# Patient Record
Sex: Male | Born: 1965 | Race: White | Hispanic: No | Smoking: Current some day smoker
Health system: Southern US, Community
[De-identification: ages and names within clinical notes are randomized; demographics above are authoritative.]

## PROBLEM LIST (undated history)

## (undated) DIAGNOSIS — F329 Major depressive disorder, single episode, unspecified: Secondary | ICD-10-CM

## (undated) DIAGNOSIS — F32A Depression, unspecified: Secondary | ICD-10-CM

## (undated) DIAGNOSIS — F419 Anxiety disorder, unspecified: Secondary | ICD-10-CM

## (undated) DIAGNOSIS — R569 Unspecified convulsions: Secondary | ICD-10-CM

## (undated) DIAGNOSIS — M810 Age-related osteoporosis without current pathological fracture: Secondary | ICD-10-CM

## (undated) HISTORY — DX: Depression, unspecified: F32.A

## (undated) HISTORY — DX: Anxiety disorder, unspecified: F41.9

## (undated) HISTORY — DX: Age-related osteoporosis without current pathological fracture: M81.0

## (undated) HISTORY — DX: Major depressive disorder, single episode, unspecified: F32.9

## (undated) HISTORY — PX: CERVICAL FUSION: SHX112

---

## 2003-07-12 ENCOUNTER — Emergency Department (HOSPITAL_COMMUNITY): Admission: EM | Admit: 2003-07-12 | Discharge: 2003-07-13 | Payer: Self-pay | Admitting: *Deleted

## 2004-09-19 ENCOUNTER — Ambulatory Visit (HOSPITAL_COMMUNITY): Admission: RE | Admit: 2004-09-19 | Discharge: 2004-09-19 | Payer: Self-pay | Admitting: *Deleted

## 2008-10-25 ENCOUNTER — Ambulatory Visit (HOSPITAL_COMMUNITY): Admission: RE | Admit: 2008-10-25 | Discharge: 2008-10-25 | Payer: Self-pay | Admitting: *Deleted

## 2010-03-31 ENCOUNTER — Encounter: Payer: Self-pay | Admitting: *Deleted

## 2011-08-19 ENCOUNTER — Ambulatory Visit (HOSPITAL_COMMUNITY): Payer: Self-pay | Admitting: Physical Therapy

## 2012-02-12 ENCOUNTER — Emergency Department (HOSPITAL_COMMUNITY)
Admission: EM | Admit: 2012-02-12 | Discharge: 2012-02-13 | Disposition: A | Discharge status: Home / Self Care | Payer: Medicaid Other | Attending: Emergency Medicine | Admitting: Emergency Medicine

## 2012-02-12 ENCOUNTER — Encounter (HOSPITAL_COMMUNITY): Payer: Self-pay

## 2012-02-12 ENCOUNTER — Emergency Department (HOSPITAL_COMMUNITY): Payer: Medicaid Other

## 2012-02-12 DIAGNOSIS — Z8669 Personal history of other diseases of the nervous system and sense organs: Secondary | ICD-10-CM | POA: Insufficient documentation

## 2012-02-12 DIAGNOSIS — Y9389 Activity, other specified: Secondary | ICD-10-CM | POA: Insufficient documentation

## 2012-02-12 DIAGNOSIS — S32009A Unspecified fracture of unspecified lumbar vertebra, initial encounter for closed fracture: Secondary | ICD-10-CM | POA: Insufficient documentation

## 2012-02-12 DIAGNOSIS — S0993XA Unspecified injury of face, initial encounter: Secondary | ICD-10-CM | POA: Insufficient documentation

## 2012-02-12 DIAGNOSIS — R404 Transient alteration of awareness: Secondary | ICD-10-CM | POA: Insufficient documentation

## 2012-02-12 DIAGNOSIS — S42401A Unspecified fracture of lower end of right humerus, initial encounter for closed fracture: Secondary | ICD-10-CM

## 2012-02-12 DIAGNOSIS — S52023A Displaced fracture of olecranon process without intraarticular extension of unspecified ulna, initial encounter for closed fracture: Secondary | ICD-10-CM | POA: Insufficient documentation

## 2012-02-12 DIAGNOSIS — F17200 Nicotine dependence, unspecified, uncomplicated: Secondary | ICD-10-CM | POA: Insufficient documentation

## 2012-02-12 HISTORY — DX: Unspecified convulsions: R56.9
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
## 2012-02-13 LAB — RAPID URINE DRUG SCREEN, HOSP PERFORMED
Barbiturates: NOT DETECTED
Cocaine: NOT DETECTED
Opiates: NOT DETECTED
Tetrahydrocannabinol: NOT DETECTED

## 2012-02-13 LAB — BASIC METABOLIC PANEL
BUN: 13 mg/dL (ref 6–23)
Calcium: 9.1 mg/dL (ref 8.4–10.5)
Chloride: 107 mEq/L (ref 96–112)
Creatinine, Ser: 0.86 mg/dL (ref 0.50–1.35)
GFR calc non Af Amer: >90 mL/min (ref >90)
Potassium: 3.6 mEq/L (ref 3.5–5.1)

## 2012-02-13 LAB — CBC WITH DIFFERENTIAL/PLATELET
Basophils Absolute: 0 10*3/uL (ref 0.0–0.1)
HCT: 35.7 % — ABNORMAL LOW (ref 39.0–52.0)
Hemoglobin: 12.3 g/dL — ABNORMAL LOW (ref 13.0–17.0)
MCH: 33.8 pg (ref 26.0–34.0)
MCV: 98.1 fL (ref 78.0–100.0)
Monocytes Absolute: 0.5 10*3/uL (ref 0.1–1.0)
Neutro Abs: 9.7 10*3/uL — ABNORMAL HIGH (ref 1.7–7.7)
Neutrophils Relative %: 84 % — ABNORMAL HIGH (ref 43–77)
RBC: 3.64 MIL/uL — ABNORMAL LOW (ref 4.22–5.81)
WBC: 11.7 10*3/uL — ABNORMAL HIGH (ref 4.0–10.5)

## 2012-02-13 LAB — URINALYSIS, ROUTINE W REFLEX MICROSCOPIC
Glucose, UA: NEGATIVE mg/dL
Ketones, ur: NEGATIVE mg/dL
Nitrite: NEGATIVE
Urobilinogen, UA: 0.2 mg/dL (ref 0.0–1.0)

## 2012-02-13 LAB — URINE MICROSCOPIC-ADD ON

## 2012-02-13 LAB — ETHANOL: Alcohol, Ethyl (B): <11 mg/dL (ref 0–11)

## 2012-02-13 MED ORDER — HYDROCODONE-ACETAMINOPHEN 7.5-325 MG PO TABS: 1.0000 | ORAL_TABLET | ORAL | Status: DC | PRN

## 2012-02-13 MED ORDER — BACLOFEN 10 MG PO TABS: 10.0000 mg | ORAL_TABLET | Freq: Three times a day (TID) | ORAL | Status: AC

## 2012-02-13 NOTE — ED Notes (Signed)
Pt sleeping upon entering room to apply splint. Pt wakes and then goes back to sleep. Refuses to allow application of splint at present.

## 2012-02-13 NOTE — ED Provider Notes (Signed)
Medical screening examination/treatment/procedure(s) were conducted as a shared visit with non-physician practitioner(s) and myself.  I personally evaluated the patient during the encounter.  Plain films reveal a minimally displaced fracture of the anterior superior endplate of L5, and a slightly comminuted fracture of the right olecranon.  No obvious head or neck trauma  Donnetta Hutching, MD 02/13/12 [phone]

## 2012-02-13 NOTE — ED Notes (Signed)
Pt resting quietly with eyes closed.  Respirations regular, even and unlabored.

## 2012-02-13 NOTE — ED Notes (Addendum)
Pt awakened. Pt alert and oriented x3.  Splint and sling applied.  Discharge instructions given.  Pt to call ride from waiting area.

## 2012-02-13 NOTE — ED Notes (Addendum)
Pt was able to contact him a ride, explained discharge instructions again to pt, pt placed in wheelchair and rolled to er lobby to wait for ride,

## 2012-02-23 ENCOUNTER — Telehealth: Payer: Self-pay | Admitting: Orthopedic Surgery

## 2012-02-23 NOTE — Telephone Encounter (Addendum)
Patient came by to request appointment regarding recent Emergency Room visit at [facility], on 02/12/12.  Patient's notes indicate fracture of L-spine and fracture of right elbow.  Offered appointment (called and left msg for patient at 2 ph#'s [phone], [phone]),  and referral has received per Dr. Haskel Khan office per [place], NPI [id number], ph# [phone], per patient's [place] Medicaid insurance.

## 2012-02-24 NOTE — Telephone Encounter (Signed)
Patient returned call, appointment scheduled.

## 2012-02-25 ENCOUNTER — Ambulatory Visit (INDEPENDENT_AMBULATORY_CARE_PROVIDER_SITE_OTHER): Payer: Medicaid Other

## 2012-02-25 ENCOUNTER — Ambulatory Visit (INDEPENDENT_AMBULATORY_CARE_PROVIDER_SITE_OTHER): Payer: Medicaid Other | Admitting: Orthopedic Surgery

## 2012-02-25 ENCOUNTER — Encounter: Payer: Self-pay | Admitting: Orthopedic Surgery

## 2012-02-25 VITALS — BP 118/70 | Ht 73.0 in | Wt 175.0 lb

## 2012-02-25 DIAGNOSIS — S42409A Unspecified fracture of lower end of unspecified humerus, initial encounter for closed fracture: Secondary | ICD-10-CM

## 2012-02-25 DIAGNOSIS — S32009A Unspecified fracture of unspecified lumbar vertebra, initial encounter for closed fracture: Secondary | ICD-10-CM

## 2012-02-25 DIAGNOSIS — S32000A Wedge compression fracture of unspecified lumbar vertebra, initial encounter for closed fracture: Secondary | ICD-10-CM

## 2012-02-25 MED ORDER — OXYCODONE-ACETAMINOPHEN 10-325 MG PO TABS: 1.0000 | ORAL_TABLET | ORAL | Status: DC | PRN

## 2012-02-25 NOTE — Patient Instructions (Signed)
WEAR SLING

## 2012-02-26 ENCOUNTER — Encounter: Payer: Self-pay | Admitting: Orthopedic Surgery
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
## 2012-03-01 ENCOUNTER — Telehealth: Payer: Self-pay | Admitting: *Deleted

## 2012-03-01 NOTE — Telephone Encounter (Signed)
No message error.

## 2012-03-02 NOTE — ED Provider Notes (Signed)
Medical screening examination/treatment/procedure(s) were performed by non-physician practitioner and as supervising physician I was immediately available for consultation/collaboration.  Donnetta Hutching, MD 03/02/12 [phone]

## 2012-03-11 ENCOUNTER — Other Ambulatory Visit: Payer: Self-pay | Admitting: Orthopedic Surgery

## 2012-03-11 ENCOUNTER — Telehealth: Payer: Self-pay | Admitting: Orthopedic Surgery

## 2012-03-11 DIAGNOSIS — S42409A Unspecified fracture of lower end of unspecified humerus, initial encounter for closed fracture: Secondary | ICD-10-CM | POA: Insufficient documentation

## 2012-03-11 MED ORDER — OXYCODONE-ACETAMINOPHEN 5-325 MG PO TABS: 1.0000 | ORAL_TABLET | ORAL | Status: AC | PRN

## 2012-03-11 NOTE — Telephone Encounter (Signed)
Pick up new Rx at office

## 2012-03-11 NOTE — Telephone Encounter (Signed)
Ronald Taylor is asking for a new Percocet prescription. His # [phone]

## 2012-03-11 NOTE — Telephone Encounter (Signed)
Patient called to pick up

## 2012-03-15 ENCOUNTER — Ambulatory Visit: Payer: Medicaid Other | Admitting: Orthopedic Surgery

## 2012-03-22 ENCOUNTER — Ambulatory Visit (INDEPENDENT_AMBULATORY_CARE_PROVIDER_SITE_OTHER): Payer: Medicaid Other | Admitting: Orthopedic Surgery

## 2012-03-22 ENCOUNTER — Encounter: Payer: Self-pay | Admitting: Orthopedic Surgery

## 2012-03-22 ENCOUNTER — Ambulatory Visit (INDEPENDENT_AMBULATORY_CARE_PROVIDER_SITE_OTHER): Payer: Medicaid Other

## 2012-03-22 VITALS — BP 100/60 | Ht 73.0 in | Wt 175.0 lb

## 2012-03-22 DIAGNOSIS — S32009A Unspecified fracture of unspecified lumbar vertebra, initial encounter for closed fracture: Secondary | ICD-10-CM | POA: Insufficient documentation

## 2012-03-22 DIAGNOSIS — M545 Low back pain, unspecified: Secondary | ICD-10-CM

## 2012-03-22 DIAGNOSIS — S42409A Unspecified fracture of lower end of unspecified humerus, initial encounter for closed fracture: Secondary | ICD-10-CM

## 2012-03-22 MED ORDER — HYDROCODONE-ACETAMINOPHEN 10-325 MG PO TABS: 1.0000 | ORAL_TABLET | ORAL | Status: DC | PRN

## 2012-03-22 NOTE — Progress Notes (Signed)
Patient ID: Ronald Taylor, male   DOB: 1966/01/23, 47 y.o.   MRN: [id number] 1. Lumbar vertebral fracture  Ambulatory referral to Physical Therapy  2. Elbow fracture  DG Elbow 2 Views Right, HYDROcodone-acetaminophen (NORCO) 10-325 MG per tablet, Ambulatory referral to Occupational Therapy  3. Low back pain  HYDROcodone-acetaminophen (NORCO) 10-325 MG per tablet, Ambulatory referral to Physical Therapy   BP 100/60  Ht 6\' 1"  (1.854 m)  Wt 175 lb (79.379 kg)  BMI 23.09 kg/m2  Chief Complaint  Patient presents with  . Follow-up    2 week recheck on right elbow fracture with xray.    He is really complaining of severe back pain today. He has a history of chronic pain his Dr. let him go for some reason she had him on Percocet 10.  He needs to see chronic pain management after his elbow has been treated  As far as his elbow goes he still has a radiolucency at the fracture site but it has not changed is nondisplaced and probably fibrous union. He has range of motion of 45-115  His back pain does not include radicular symptoms  I recommend occupational therapy to improve the range of motion in his elbow  He switched over to hydrocodone 10 mg  He can have back therapy as well  We'll see him in about 6 or 7 weeks and then make appropriate referrals for pain management and for lumbar management.

## 2012-03-22 NOTE — Patient Instructions (Addendum)
Start ROM exercises for right elbow and lower back   Please call the hospital to START PHYSICAL THERAPY

## 2012-04-07 ENCOUNTER — Ambulatory Visit (HOSPITAL_COMMUNITY): Admission: RE | Admit: 2012-04-07 | Payer: No Typology Code available for payment source | Source: Ambulatory Visit

## 2012-04-08 ENCOUNTER — Ambulatory Visit (HOSPITAL_COMMUNITY): Payer: No Typology Code available for payment source | Attending: Orthopedic Surgery | Admitting: Specialist

## 2012-04-13 ENCOUNTER — Ambulatory Visit (HOSPITAL_COMMUNITY): Admission: RE | Admit: 2012-04-13 | Payer: No Typology Code available for payment source | Source: Ambulatory Visit

## 2012-05-19 ENCOUNTER — Encounter: Payer: Self-pay | Admitting: Orthopedic Surgery

## 2012-05-19 ENCOUNTER — Ambulatory Visit: Payer: Medicaid Other | Admitting: Orthopedic Surgery

## 2013-03-16 ENCOUNTER — Telehealth (HOSPITAL_COMMUNITY): Payer: Self-pay | Admitting: Dietician

## 2013-03-18 NOTE — Telephone Encounter (Addendum)
Received message from pt left on 03/16/13 at 1502. Requesting call back.

## 2013-03-18 NOTE — Telephone Encounter (Signed)
Called back at 1056. 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 Scheduled for 03/29/13 class at 1000. Pt also provided cell phone number [phone] as best contact.

## 2013-03-18 NOTE — Telephone Encounter (Signed)
Called back on 03/16/13 at 1527. Pt reports he cannot take call now and would like call back later. Agreeable to calling back tomorrow (03/17/13) PM.

## 2013-03-21 ENCOUNTER — Ambulatory Visit: Payer: Medicaid Other | Admitting: Neurology

## 2013-03-23 ENCOUNTER — Telehealth (HOSPITAL_COMMUNITY): Payer: Self-pay | Admitting: Dietician

## 2013-03-23 NOTE — Telephone Encounter (Signed)
Called and left voicemail at 1030. Registered for 04/12/13 DM class at 1000.

## 2013-03-23 NOTE — Telephone Encounter (Signed)
Received multiple messages from Ronald Taylor on 03/22/13 at 1541 and 1545. He reports unable to attend DM class on 03/29/13 as he has an appointment with the neurologist. He would like to reschedule.

## 2013-03-29 ENCOUNTER — Encounter: Payer: Self-pay | Admitting: Neurology

## 2013-03-29 ENCOUNTER — Ambulatory Visit (INDEPENDENT_AMBULATORY_CARE_PROVIDER_SITE_OTHER): Payer: Medicaid Other | Admitting: Neurology

## 2013-03-29 ENCOUNTER — Encounter (INDEPENDENT_AMBULATORY_CARE_PROVIDER_SITE_OTHER): Payer: Self-pay

## 2013-03-29 VITALS — BP 131/84 | HR 68 | Ht 71.25 in | Wt 179.2 lb

## 2013-03-29 DIAGNOSIS — R569 Unspecified convulsions: Secondary | ICD-10-CM

## 2013-03-29 DIAGNOSIS — G8929 Other chronic pain: Secondary | ICD-10-CM

## 2013-03-29 MED ORDER — LEVETIRACETAM 1000 MG PO TABS: 1000.0000 mg | ORAL_TABLET | Freq: Two times a day (BID) | ORAL | Status: AC

## 2013-03-29 NOTE — Progress Notes (Signed)
[facility]    Provider:  Dr Hosie PoissonSumner Referring Provider: Samuel JesterButler, Cynthia, DO Primary Care Physician:  Avon GullyFANTA,TESFAYE, MD  CC:  Seizure disorder  HPI:  Ronald Taylor is a 48 y.o. male here as a referral from Dr. Charm BargesButler for a second opinion on his treatment from Dr Mikey CollegeFoster/[place].   In June 2014, he had a feeling that he was about to have a seizure, went to ED, subsequently had a seizure. At that time was found to have supra-therapeutic dilantin level. At the time he was currently taking 100mg  QID. After this event, Dr Harriett Sine[place]s office decided to try to take him off the Dilantin and start Keppra. When attempting to taper down the Dilantin he developed a severe headache, felt he was having Dilantin withdrawal so he started taking more Dilantin again.  Dr Harriett [place] office disagreed. Overall feels he is not being treated well and wants a 2nd opinion.   Currently taking Dilantin 100mg  TID and Keppra 750mg  bid. He reports his last seizure was in June 2014 or 2012 as he is unsure if June event was a seizure. He started having seizures around 30years ago. Unsure why he started having seizures. Typically will occur once every 2-3 years. Patient is unsure how long he has been on Dilantin. Initially started on phenobarbital, unsure why or when they switched. Patient unable to provide further history.   Patient has history of chronic back pain.   Review of Systems: Out of a complete 14 system review, the patient complains of only the following symptoms, and all other reviewed systems are negative. Other for seizures fatigue neck pain  History   Social History  . Marital Status: Divorced    Spouse Name: N/A    Number of Children: 0  . Years of Education: GED   Occupational History  . Not on file.   Social History Main Topics  . Smoking status: Current Some Day Smoker -- 2.00 packs/day  . Smokeless tobacco: Not on file  . Alcohol Use: No  . Drug Use: No  . Sexual  Activity: Not on file   Other Topics Concern  . Not on file   Social History Narrative   Patient lives at home alone, has 0 children   Patient is right handed   Education level is GED   Caffeine consumption is 1 gallon of tea daily    Family History  Problem Relation Age of Onset  . Arthritis    . Cancer    . Diabetes      Past Medical History  Diagnosis Date  . Seizures   . Anxiety   . Depression   . Osteoporosis     Past Surgical History  Procedure Laterality Date  . Cervical fusion      Current Outpatient Prescriptions  Medication Sig Dispense Refill  . diazepam (VALIUM) 10 MG tablet Take 10 mg by mouth 4 (four) times daily.      Marland Kitchen. FOLIC ACID PO Take by mouth.      Marland Kitchen. HYDROcodone-acetaminophen (NORCO) 10-325 MG per tablet Take 1 tablet by mouth every 4 (four) hours as needed for pain.  42 tablet  3  . levETIRAcetam (KEPPRA) 500 MG tablet Take 500 mg by mouth 2 (two) times daily.      . Omeprazole (PRILOSEC PO) Take by mouth daily.      Marland Kitchen. oxyCODONE-acetaminophen (ENDOCET) 10-325 MG per tablet Take 1 tablet by mouth every 4 (four) hours as needed for pain.      .Marland Kitchen  phenytoin (DILANTIN) 100 MG ER capsule Take 100 mg by mouth 4 (four) times daily.       No current facility-administered medications for this visit.    Allergies as of 03/29/2013  . (No Known Allergies)    Vitals: BP 131/84  Pulse 68  Ht 5' 11.25" (1.81 m)  Wt 179 lb 4 oz (81.307 kg)  BMI 24.82 kg/m2 Last Weight:  Wt Readings from Last 1 Encounters:  03/29/13 179 lb 4 oz (81.307 kg)   Last Height:   Ht Readings from Last 1 Encounters:  03/29/13 5' 11.25" (1.81 m)     Physical exam: Exam: Gen: NAD, conversant Eyes: anicteric sclerae, moist conjunctivae HENT: Atraumatic, oropharynx clear Neck: Trachea midline; supple,  Lungs: CTA, no wheezing, rales, rhonic                          CV: RRR, no MRG Abdomen: Soft, non-tender;  Extremities: No peripheral edema  Skin: Normal temperature,  no rash,  Psych: Appropriate affect, pleasant  Neuro: MS: AA&Ox3, appropriately interactive, normal affect   Speech: fluent w/o paraphasic error  Memory: good recent and remote recall  CN: PERRL, EOMI no nystagmus, no ptosis, sensation intact to LT V1-V3 bilat, face symmetric, no weakness, hearing grossly intact, palate elevates symmetrically, shoulder shrug 5/5 bilat,  tongue protrudes midline, no fasiculations noted.  Motor: normal bulk and tone Strength: 5/5  In all extremities with some giveaway weakness noted  Coord: rapid alternating and point-to-point (FNF, HTS) movements intact.  Reflexes: symmetrical, bilat downgoing toes  Sens: LT intact in all extremities  Gait: posture, stance, stride and arm-swing normal. Tandem gait intact. Able to walk on heels and toes. Romberg absent.   Assessment:  After physical and neurologic examination, review of laboratory studies, imaging, neurophysiology testing and pre-existing records, assessment will be reviewed on the problem list.  Plan:  Treatment plan and additional workup will be reviewed under Problem List.  1)Seizures 2)Chronic pain  48 year old gentleman with history of seizure disorder and chronic pain presenting for initial evaluation of seizure management. Followed by outside neurologist and is requesting a second opinion/transfer of care as he disagrees with the plan of care of his current neurologist. Patients physical exam is unremarkable. He is currently taking Keppra 750 mg twice a day and Dilantin 100 mg 3 times a day. He is only having one seizure every 2-3 years. I counseled him that there is no indication for until therapy at this time. We discussed the benefits and risks of Keppra and Dilantin, after a long discussion we will taper off Dilantin slowly and increase Keppra to 1000 mg twice a day. He expressed understanding of the benefits and risks of this decision. Will followup in 6 months or earlier as  needed.   Elspeth Cho, DO  [facility] [address] [place], [place] [address]  Phone [phone] Fax [phone]

## 2013-03-29 NOTE — Patient Instructions (Signed)
Overall you are doing fairly well but I do want to suggest a few things today:   Remember to drink plenty of fluid, eat healthy meals and do not skip any meals. Try to eat protein with a every meal and eat a healthy snack such as fruit or nuts in between meals. Try to keep a regular sleep-wake schedule and try to exercise daily, particularly in the form of walking, 20-30 minutes a day, if you can.   As far as your medications are concerned, I would like to suggest the following: 1)Increase the Keppra to 1000mg  twice a day 2)We will taper you off the Dilantin using the following schedule: Week 1 and 2: Take 100mg  twice a day Week 3 and 4: Take 100mg  once a day then discontinue  We will check some blood work today. Please have this completed after checking out.    I would like to see you back in 6 months, sooner if we need to. Please call us with any interim questions, concerns, problems, updates or refill requests.   Please also call us for any test results so we can go over those with you on the phone.  My clinical assistant and will answer any of your questions and relay your messages to me and also relay most of my messages to you.   Our phone number is [phone]. We also have an after hours call service for urgent matters and there is a physician on-call for urgent questions. For any emergencies you know to call 911 or go to the nearest emergency room

## 2013-03-30 ENCOUNTER — Telehealth: Payer: Self-pay | Admitting: *Deleted

## 2013-03-30 ENCOUNTER — Telehealth: Payer: Self-pay | Admitting: Neurology

## 2013-03-30 NOTE — Telephone Encounter (Signed)
Patient calling to follow up, says he wants his orders sent to his doctor so that he can have [place]. Please call the patient.

## 2013-03-30 NOTE — Telephone Encounter (Signed)
Patient calling to ask about having his bloodwork orders sent to his doctor in [place] since he left yesterday without getting them done. Please refer to other phone message, encounter was closed by accident.

## 2013-03-30 NOTE — Telephone Encounter (Signed)
Called patient to inform him that his lab orders was faxed today to his physician Dr. Felecia ShellingFanta at [phone], per patient requested. I advised the patient that if he has any other problems, questions or concerns to call the office. Patient verbalized understanding.

## 2013-03-31 NOTE — Telephone Encounter (Signed)
Kim from Dr. Letitia NeriFanta's office calling to state that we can cancel the lab orders since they will be doing it at their office.

## 2013-03-31 NOTE — Telephone Encounter (Signed)
Dr. Hosie PoissonSumner, this patient left yesterday without getting labs drawn. He called the office and wanted the order faxed to Dr. Letitia NeriFanta's off which Lynden AngCathy has done. The office has called back stating that they need a signed lab order because they do not do labs at their office then send them to the hospital there. Please just left me know what needs to be done.

## 2013-04-07 ENCOUNTER — Telehealth: Payer: Self-pay | Admitting: Neurology

## 2013-04-07 NOTE — Telephone Encounter (Signed)
Called patient, left message stating it is ok to use a TENs unit. Instructed him to call if he has any further questions.

## 2013-04-07 NOTE — Telephone Encounter (Signed)
States another physician  Prescribed him a tens unit and he has some questions about whether or not he can use it. Please call

## 2013-04-11 ENCOUNTER — Telehealth (HOSPITAL_COMMUNITY): Payer: Self-pay | Admitting: Dietician

## 2013-04-11 NOTE — Telephone Encounter (Signed)
Received voicemail left on 0825. Unable to make DM class tomorrow that he was registered for. Would like to reschedule. Call back at [phone].

## 2013-04-12 NOTE — Telephone Encounter (Signed)
Called at 1315. Left message. Offered DM class on 04/26/13 at 1000.

## 2013-05-10 ENCOUNTER — Telehealth (HOSPITAL_COMMUNITY): Payer: Self-pay | Admitting: Dietician

## 2013-05-10 NOTE — Telephone Encounter (Signed)
Pt was a no-show for Diabetes Class appointment scheduled for 05/10/2013 at 1000.

## 2013-09-26 ENCOUNTER — Encounter (INDEPENDENT_AMBULATORY_CARE_PROVIDER_SITE_OTHER): Payer: Self-pay

## 2013-09-26 ENCOUNTER — Ambulatory Visit (INDEPENDENT_AMBULATORY_CARE_PROVIDER_SITE_OTHER): Payer: Medicaid Other | Admitting: Neurology

## 2013-09-26 ENCOUNTER — Encounter: Payer: Self-pay | Admitting: Neurology

## 2013-09-26 VITALS — BP 89/60 | HR 76 | Ht 71.0 in | Wt 167.8 lb

## 2013-09-26 DIAGNOSIS — R569 Unspecified convulsions: Secondary | ICD-10-CM

## 2013-09-26 DIAGNOSIS — G8929 Other chronic pain: Secondary | ICD-10-CM

## 2013-09-26 NOTE — Patient Instructions (Signed)
Overall you are doing fairly well but I do want to suggest a few things today:   Remember to drink plenty of fluid, eat healthy meals and do not skip any meals. Try to eat protein with a every meal and eat a healthy snack such as fruit or nuts in between meals. Try to keep a regular sleep-wake schedule and try to exercise daily, particularly in the form of walking, 20-30 minutes a day, if you can.   As far as your medications are concerned, I would like to suggest you continue on keppra 1000mg  twice a day  Follow up as needed. Please call us with any interim questions, concerns, problems, updates or refill requests.   My clinical assistant and will answer any of your questions and relay your messages to me and also relay most of my messages to you.   Our phone number is [phone]. We also have an after hours call service for urgent matters and there is a physician on-call for urgent questions. For any emergencies you know to call 911 or go to the nearest emergency room

## 2013-09-26 NOTE — Progress Notes (Signed)
[facility]    Provider:  Dr Hosie PoissonSumner Referring Provider: Avon GullyFanta, Tesfaye, MD Primary Care Physician:  Avon GullyFANTA,TESFAYE, MD  CC:  Seizure disorder  HPI:  Ronald Taylor is a 48 y.o. male here as a follow up from Dr. Felecia ShellingFanta for his seizure disorder. States overall he is doing well. No seizure episodes since his last visit. No aurea or sensation of a seizure coming. Continues on Keppra 1000mg  BID. Tolerating well, no adverse effects.   Continues to have intermittent cervical neck pain. Tried using a TENs unit but did not notice much benefit. Continues to use a heating pad for symptomatic relief.   Initial visit 03/2013: In June 2014, he had a feeling that he was about to have a seizure, went to ED, subsequently had a seizure. At that time was found to have supra-therapeutic dilantin level. At the time he was currently taking 100mg  QID. After this event, Dr Harriett Sineoonquahs office decided to try to take him off the Dilantin and start Keppra. When attempting to taper down the Dilantin he developed a severe headache, felt he was having Dilantin withdrawal so he started taking more Dilantin again.  Dr Harriett S[place]s office disagreed. Overall feels he is not being treated well and wants a 2nd opinion.   Currently taking Dilantin 100mg  TID and Keppra 750mg  bid. He reports his last seizure was in June 2014 or 2012 as he is unsure if June event was a seizure. He started having seizures around 30years ago. Unsure why he started having seizures. Typically will occur once every 2-3 years. Patient is unsure how long he has been on Dilantin. Initially started on phenobarbital, unsure why or when they switched. Patient unable to provide further history.   Patient has history of chronic back pain.   Review of Systems: Out of a complete 14 system review, the patient complains of only the following symptoms, and all other reviewed systems are negative. Other for seizures fatigue neck pain  History   Social  History  . Marital Status: Divorced    Spouse Name: N/A    Number of Children: 0  . Years of Education: GED   Occupational History  . Not on file.   Social History Main Topics  . Smoking status: Current Some Day Smoker -- 2.00 packs/day  . Smokeless tobacco: Not on file  . Alcohol Use: No  . Drug Use: No  . Sexual Activity: Not on file   Other Topics Concern  . Not on file   Social History Narrative   Patient lives at home alone, has 0 children   Patient is right handed   Education level is GED   Caffeine consumption is 1 gallon of tea daily    Family History  Problem Relation Age of Onset  . Arthritis    . Cancer    . Diabetes      Past Medical History  Diagnosis Date  . Seizures   . Anxiety   . Depression   . Osteoporosis     Past Surgical History  Procedure Laterality Date  . Cervical fusion      Current Outpatient Prescriptions  Medication Sig Dispense Refill  . diazepam (VALIUM) 10 MG tablet Take 5 mg by mouth 4 (four) times daily.       . diclofenac sodium (VOLTAREN) 1 % GEL Apply 2 g topically as needed.      Marland Kitchen. FOLIC ACID PO Take by mouth.      . levETIRAcetam (KEPPRA) 1000 MG tablet  Take 1 tablet (1,000 mg total) by mouth 2 (two) times daily.  180 tablet  3  . Omeprazole (PRILOSEC PO) Take by mouth daily.      Marland Kitchen oxyCODONE-acetaminophen (ENDOCET) 10-325 MG per tablet Take 1 tablet by mouth 3 (three) times daily.       . Vortioxetine HBr 10 MG TABS Take 10 mg by mouth daily.       No current facility-administered medications for this visit.    Allergies as of 09/26/2013  . (No Known Allergies)    Vitals: BP 89/60  Pulse 76  Ht 5\' 11"  (1.803 m)  Wt 167 lb 12.8 oz (76.114 kg)  BMI 23.41 kg/m2 Last Weight:  Wt Readings from Last 1 Encounters:  09/26/13 167 lb 12.8 oz (76.114 kg)   Last Height:   Ht Readings from Last 1 Encounters:  09/26/13 5\' 11"  (1.803 m)     Physical exam: Exam: Gen: NAD, conversant Eyes: anicteric sclerae, moist  conjunctivae HENT: Atraumatic, oropharynx clear Neck: Trachea midline; supple,  Lungs: CTA, no wheezing, rales, rhonic                          CV: RRR, no MRG Abdomen: Soft, non-tender;  Extremities: No peripheral edema  Skin: Normal temperature, no rash,  Psych: Appropriate affect, pleasant  Neuro: MS: AA&Ox3, appropriately interactive, normal affect   Speech: fluent w/o paraphasic error  Memory: good recent and remote recall  CN: PERRL, EOMI no nystagmus, no ptosis, sensation intact to LT V1-V3 bilat, face symmetric, no weakness, hearing grossly intact, palate elevates symmetrically, shoulder shrug 5/5 bilat,  tongue protrudes midline, no fasiculations noted.  Motor: normal bulk and tone Strength: 5/5  In all extremities with some giveaway weakness noted  Coord: rapid alternating and point-to-point (FNF, HTS) movements intact.  Reflexes: symmetrical, bilat downgoing toes  Sens: LT intact in all extremities  Gait: posture, stance, stride and arm-swing normal. Tandem gait intact. Able to walk on heels and toes. Romberg absent.   Assessment:  After physical and neurologic examination, review of laboratory studies, imaging, neurophysiology testing and pre-existing records, assessment will be reviewed on the problem list.  Plan:  Treatment plan and additional workup will be reviewed under Problem List.  1)Seizures 2)Chronic pain  48 year old gentleman with history of seizure disorder and chronic pain presenting for follow up evaluation of seizure management. At last visit he was tapered off his dilantin and his keppra was increased to 1000mg  BID. He is tolerating this change well with no seizure activity noted. He wishes to follow up with his primary care physician for refills. Will follow up as needed.    Elspeth Cho, DO  [facility] [address] [place], [place] [address]  Phone [phone] Fax [phone]

## 2013-10-13 NOTE — Telephone Encounter (Signed)
Noted  

## 2013-12-23 ENCOUNTER — Other Ambulatory Visit (HOSPITAL_COMMUNITY): Payer: Self-pay | Admitting: Family Medicine

## 2013-12-23 ENCOUNTER — Ambulatory Visit (HOSPITAL_COMMUNITY)
Admission: RE | Admit: 2013-12-23 | Discharge: 2013-12-23 | Disposition: A | Discharge status: Home / Self Care | Payer: Medicaid Other | Source: Ambulatory Visit | Attending: Family Medicine | Admitting: Family Medicine

## 2013-12-23 DIAGNOSIS — T148XXA Other injury of unspecified body region, initial encounter: Secondary | ICD-10-CM

## 2013-12-23 DIAGNOSIS — X58XXXA Exposure to other specified factors, initial encounter: Secondary | ICD-10-CM | POA: Diagnosis not present

## 2013-12-23 DIAGNOSIS — S62396A Other fracture of fifth metacarpal bone, right hand, initial encounter for closed fracture: Secondary | ICD-10-CM | POA: Diagnosis not present

## 2013-12-23 DIAGNOSIS — S60450A Superficial foreign body of right index finger, initial encounter: Secondary | ICD-10-CM | POA: Insufficient documentation

## 2013-12-23 DIAGNOSIS — M79641 Pain in right hand: Secondary | ICD-10-CM | POA: Diagnosis present

## 2013-12-30 ENCOUNTER — Telehealth: Payer: Self-pay | Admitting: Orthopedic Surgery
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
## 2014-01-02 NOTE — Telephone Encounter (Signed)
Standard appointment 

## 2014-01-02 NOTE — Telephone Encounter (Signed)
Called back to offer appointment following Dr Mort SawyersHarrison's review; left voice mail message for patient to return call (offer Thursday, 01/05/14).

## 2014-01-02 NOTE — Telephone Encounter (Signed)
Standard appointment

## 2014-01-05 ENCOUNTER — Encounter: Payer: Self-pay | Admitting: Orthopedic Surgery

## 2014-01-05 NOTE — Telephone Encounter (Signed)
01/05/14 called back to patient again, left another voice message, offering appointment.  Notified referring provider of status.

## 2014-01-11 NOTE — Telephone Encounter (Signed)
No response to calls, letter, referring primary care aware.
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
## 2014-09-08 DEATH — deceased
